# Patient Record
Sex: Male | Born: 1965 | Race: White | Hispanic: No | Marital: Married
Health system: Southern US, Community
[De-identification: ages and names within clinical notes are randomized; demographics above are authoritative.]

---

## 2019-08-18 ENCOUNTER — Emergency Department (HOSPITAL_COMMUNITY): Payer: BC Managed Care – PPO

## 2019-08-18 ENCOUNTER — Emergency Department (HOSPITAL_COMMUNITY)
Admission: EM | Admit: 2019-08-18 | Discharge: 2019-08-18 | Discharge status: Against Medical Advice | Payer: BC Managed Care – PPO | Attending: Emergency Medicine | Admitting: Emergency Medicine

## 2019-08-18 ENCOUNTER — Other Ambulatory Visit: Payer: Self-pay

## 2019-08-18 DIAGNOSIS — L03032 Cellulitis of left toe: Secondary | ICD-10-CM | POA: Insufficient documentation

## 2019-08-18 DIAGNOSIS — L02612 Cutaneous abscess of left foot: Secondary | ICD-10-CM | POA: Diagnosis not present

## 2019-08-18 DIAGNOSIS — M79675 Pain in left toe(s): Secondary | ICD-10-CM | POA: Diagnosis present

## 2019-08-18 LAB — CBC WITH DIFFERENTIAL/PLATELET
Abs Immature Granulocytes: 0.03 10*3/uL (ref 0.00–0.07)
Basophils Absolute: 0.1 10*3/uL (ref 0.0–0.1)
Basophils Relative: 1 %
Eosinophils Absolute: 0.1 10*3/uL (ref 0.0–0.5)
Eosinophils Relative: 1 %
HCT: 43.9 % (ref 39.0–52.0)
Hemoglobin: 15.5 g/dL (ref 13.0–17.0)
Immature Granulocytes: 0 %
Lymphocytes Relative: 13 %
Lymphs Abs: 1.2 10*3/uL (ref 0.7–4.0)
MCH: 33 pg (ref 26.0–34.0)
MCHC: 35.3 g/dL (ref 30.0–36.0)
MCV: 93.4 fL (ref 80.0–100.0)
Monocytes Absolute: 0.5 10*3/uL (ref 0.1–1.0)
Monocytes Relative: 5 %
Neutro Abs: 7.2 10*3/uL (ref 1.7–7.7)
Neutrophils Relative %: 80 %
Platelets: 166 10*3/uL (ref 150–400)
RBC: 4.7 MIL/uL (ref 4.22–5.81)
RDW: 11.8 % (ref 11.5–15.5)
WBC: 8.9 10*3/uL (ref 4.0–10.5)
nRBC: 0 % (ref 0.0–0.2)

## 2019-08-18 LAB — BASIC METABOLIC PANEL
Anion gap: 13 (ref 5–15)
BUN: 9 mg/dL (ref 6–20)
CO2: 24 mmol/L (ref 22–32)
Calcium: 9.8 mg/dL (ref 8.9–10.3)
Chloride: 102 mmol/L (ref 98–111)
Creatinine, Ser: 0.78 mg/dL (ref 0.61–1.24)
GFR calc Af Amer: >60 mL/min (ref >60)
GFR calc non Af Amer: >60 mL/min (ref >60)
Glucose, Bld: 145 mg/dL — ABNORMAL HIGH (ref 70–99)
Potassium: 4.7 mmol/L (ref 3.5–5.1)
Sodium: 139 mmol/L (ref 135–145)

## 2019-08-18 LAB — URINALYSIS, ROUTINE W REFLEX MICROSCOPIC
Bilirubin Urine: NEGATIVE
Glucose, UA: NEGATIVE mg/dL
Hgb urine dipstick: NEGATIVE
Ketones, ur: 5 mg/dL — AB
Leukocytes,Ua: NEGATIVE
Nitrite: NEGATIVE
Protein, ur: NEGATIVE mg/dL
Specific Gravity, Urine: 1.012 (ref 1.005–1.030)
pH: 5 (ref 5.0–8.0)

## 2019-08-18 LAB — PROTIME-INR
INR: 1 (ref 0.8–1.2)
Prothrombin Time: 12.7 seconds (ref 11.4–15.2)

## 2019-08-18 LAB — LACTIC ACID, PLASMA: Lactic Acid, Venous: 2.1 mmol/L (ref 0.5–1.9)

## 2019-08-18 MED ORDER — LIDOCAINE HCL (PF) 1 % IJ SOLN
5.0000 mL | Freq: Once | INTRAMUSCULAR | Status: AC
  Administered 2019-08-18: 5 mL
  Filled 2019-08-18: qty 30

## 2019-08-18 MED ORDER — VANCOMYCIN HCL IN DEXTROSE 1-5 GM/200ML-% IV SOLN
1000.0000 mg | Freq: Once | INTRAVENOUS | Status: AC
  Administered 2019-08-18: 1000 mg via INTRAVENOUS
  Filled 2019-08-18: qty 200

## 2019-08-18 MED ORDER — DOXYCYCLINE HYCLATE 100 MG PO CAPS: 100.0000 mg | ORAL_CAPSULE | Freq: Two times a day (BID) | ORAL | 0 refills | Status: AC

## 2019-08-18 MED ORDER — SODIUM CHLORIDE 0.9 % IV BOLUS
1000.0000 mL | Freq: Once | INTRAVENOUS | Status: AC
  Administered 2019-08-18: 1000 mL via INTRAVENOUS

## 2019-08-18 MED ORDER — SODIUM CHLORIDE 0.9 % IV BOLUS: 1000.0000 mL | Freq: Once | INTRAVENOUS | Status: DC

## 2019-08-18 NOTE — ED Notes (Signed)
Discharge paperwork and prescription given to pt.  Pt with no questions or concerns at this time, ambulatory at discharge, NAD.

## 2019-08-18 NOTE — ED Notes (Signed)
Blood cultures x2 collected prior to antibiotic administration.  

## 2019-08-18 NOTE — ED Triage Notes (Signed)
Pt reports noticing red spot on middle of left middle toe last weekend.  Pt reports since that time red spot worsened, got larger, now scabbed over.  Entire toe is red, spreading to ventral portion of left foot.  Pt saw MD who prescribed (Friday 3/12)  antibiotics, pt reports taking as prescribed.

## 2019-08-18 NOTE — ED Provider Notes (Signed)
[facility] DEPT Provider Note   CSN: [id number] Arrival date & time: 08/18/19  0844    History Toe Pain  Jerry Francis is a 54 y.o. male with no significant past medical history who presents for evaluation of toe pain. Pain began 1 week ago. Started on ABX Bactrim- 3 days ago. Compliant with meds. Unsure if dc from toe. No prior hx of IVD, diabetes, osteomyelitis. Denies fever, chills, N/V, CP, SOB, abd pain, paresthesias. Has noted redness now progressing up from toe into foot with increased redness, warmth. Denies additional aggravating or alleviating factors. Rates pain a 3/10.   No recent injury or trauma.  UP to date on Tetanus- 1 year ago  History obtained from patient and past medical records. No interpretor was used.  HPI     No past medical history on file.  There are no problems to display for this patient.   History reviewed.     No family history on file.  Social History   Tobacco Use  . Smoking status: Not on file  Substance Use Topics  . Alcohol use: Not on file  . Drug use: Not on file    Home Medications Prior to Admission medications   Medication Sig Start Date End Date Taking? Authorizing Provider  doxycycline (VIBRAMYCIN) 100 MG capsule Take 1 capsule (100 mg total) by mouth 2 (two) times daily. 08/18/19   Dula Havlik A, PA-C    Allergies    Patient has no known allergies.  Review of Systems   Review of Systems  Constitutional: Negative.   HENT: Negative.   Respiratory: Negative.   Cardiovascular: Negative.   Gastrointestinal: Negative.   Genitourinary: Negative.   Skin: Positive for wound.  Neurological: Negative.   All other systems reviewed and are negative.   Physical Exam Updated Vital Signs BP (!) 155/98 (BP Location: Right Arm)   Pulse (!) 113   Temp (S) 99.3 F (37.4 C) (Rectal)   Resp 20   Ht 5\' 9"  (1.753 m)   Wt 88.5 kg   SpO2 98%   BMI 28.80 kg/m   Physical Exam Vitals and  nursing note reviewed.  Constitutional:      General: He is not in acute distress.    Appearance: He is well-developed. He is not ill-appearing, toxic-appearing or diaphoretic.  HENT:     Head: Normocephalic and atraumatic.     Nose: Nose normal.     Mouth/Throat:     Mouth: Mucous membranes are moist.     Pharynx: Oropharynx is clear.  Eyes:     Pupils: Pupils are equal, round, and reactive to light.  Cardiovascular:     Rate and Rhythm: Regular rhythm. Tachycardia present.     Pulses: Normal pulses.     Heart sounds: Normal heart sounds.     Comments: HR to 130 in room. Pulmonary:     Effort: Pulmonary effort is normal. No respiratory distress.     Breath sounds: Normal breath sounds.     Comments: Speaks in full sentences without difficulty. Abdominal:     General: Bowel sounds are normal. There is no distension.     Palpations: Abdomen is soft.  Musculoskeletal:        General: Normal range of motion.     Cervical back: Normal range of motion and neck supple.     Comments: Compartments sfot. Tenderness diffusely to 2nd digit on LLE. Wiggles toes without difficulty.  Skin:    General: Skin is  warm and dry.     Capillary Refill: Capillary refill takes less than 2 seconds.     Findings: Lesion present.     Comments: Cap refill intact. Erythema, warmth to LLE 2nd digit just extending into the dorsal aspect of foot. Yellow crusted drainage surrounding nail bed. Crusted ulceration over PIP to 2nd digit.  Neurological:     Mental Status: He is alert.     Sensory: Sensation is intact.     Motor: Motor function is intact.     Coordination: Coordination is intact.     Gait: Gait is intact.     Comments: Intact sensation. Ambulatory without difficulty.        ED Results / Procedures / Treatments   Labs (all labs ordered are listed, but only abnormal results are displayed) Labs Reviewed  BASIC METABOLIC PANEL - Abnormal; Notable for the following components:      Result  Value   Glucose, Bld 145 (*)    All other components within normal limits  LACTIC ACID, PLASMA - Abnormal; Notable for the following components:   Lactic Acid, Venous 2.1 (*)    All other components within normal limits  URINALYSIS, ROUTINE W REFLEX MICROSCOPIC - Abnormal; Notable for the following components:   Ketones, ur 5 (*)    All other components within normal limits  CULTURE, BLOOD (ROUTINE X 2)  CULTURE, BLOOD (ROUTINE X 2)  CBC WITH DIFFERENTIAL/PLATELET  LACTIC ACID, PLASMA  PROTIME-INR    EKG None  Radiology DG Foot Complete Left  Result Date: 08/18/2019 CLINICAL DATA:  Ulcer with cellulitis to second digit EXAM: LEFT FOOT - COMPLETE 3+ VIEW COMPARISON:  None. FINDINGS: Alignment is anatomic. No acute fracture. There are no erosive changes. No periosteal reaction identified. No soft tissue air. Degenerative changes at the first metatarsophalangeal joint. IMPRESSION: No evidence of acute osteomyelitis. Electronically Signed   By: Guadlupe Spanish M.D.   On: 08/18/2019 10:09    Procedures .Marland KitchenIncision and Drainage  Date/Time: 08/18/2019 10:52 AM Performed by: Linwood Dibbles, PA-C Authorized by: Linwood Dibbles, PA-C   Consent:    Consent obtained:  Verbal   Consent given by:  Patient   Risks discussed:  Bleeding, incomplete drainage, pain and damage to other organs   Alternatives discussed:  No treatment Universal protocol:    Procedure explained and questions answered to patient or proxy's satisfaction: yes     Relevant documents present and verified: yes     Test results available and properly labeled: yes     Imaging studies available: yes     Required blood products, implants, devices, and special equipment available: yes     Site/side marked: yes     Immediately prior to procedure a time out was called: yes     Patient identity confirmed:  Verbally with patient Location:    Type:  Abscess   Location:  Lower extremity   Lower extremity location:  Toe    Toe location:  L second toe Pre-procedure details:    Skin preparation:  Betadine Anesthesia (see MAR for exact dosages):    Anesthesia method:  Local infiltration   Local anesthetic:  Lidocaine 1% w/o epi Procedure type:    Complexity:  Complex Procedure details:    Incision types:  Single straight   Incision depth:  Subcutaneous   Scalpel blade:  11   Wound management:  Probed and deloculated, irrigated with saline and extensive cleaning   Drainage:  Purulent   Drainage amount:  Moderate   Wound treatment:  Wound left open Post-procedure details:    Patient tolerance of procedure:  Tolerated well, no immediate complications   (including critical care time)  Medications Ordered in ED Medications  sodium chloride 0.9 % bolus 1,000 mL (has no administration in time range)  sodium chloride 0.9 % bolus 1,000 mL (0 mLs Intravenous Stopped 08/18/19 1051)  vancomycin (VANCOCIN) IVPB 1000 mg/200 mL premix (0 mg Intravenous Stopped 08/18/19 1051)  lidocaine (PF) (XYLOCAINE) 1 % injection 5 mL (5 mLs Infiltration Given by Other 08/18/19 1038)    ED Course  I have reviewed the triage vital signs and the nursing notes.  Pertinent labs & imaging results that were available during my care of the patient were reviewed by me and considered in my medical decision making (see chart for details).  54 year old presents for evaluation of toe pain.  Afebrile, nonseptic appearing.  Patient currently on Bactrim x3 days with worsening erythema.  He has some crusted drainage to second digit on left lower extremity.  Compartments are soft. Brisk capillary refill.  Patient is tachycardic to 130 in the room.  No history of IV drug use, osteomyelitis or diabetes.  Patient does not member any injury or breaks in skin.  Worsening erythema since starting Bactrim.  Will get labs, IV fluids, imaging reevaluate. Up to date on tetanus.  Labs and imaging personally reviewed and interpreted: CBC without leukocytosis  BMP mildly elevated glucose however no additional electrolyte, renal or liver abnormality. DG foot no osteomyelitis, fracture, dislocation. Lactic acid 2.1 BC pending EKG tachycardic. Discussed with attending. Low suspicion for SVT. Likely sinus tach.  Patient reassessed. Tachycardia improving. Fluctuance at proximal nail bed. Will trial I&D at bedside.  I & D performed with moderate purulent drainage at proximal nailbed. Discussed inpatient admission for IV ABx and possible MR to r/o osteomyelitis given worsening cellulitis despite ABX at home. Patient adamant about leaving.  Patient's tachycardia has improved however he still does have some tachycardia.  He is refusing second bag of IV fluids. He was given Vanc here in the ED. Patient does meet SIRS criteria with tachycardia, elevated lactic acid and source of infection. Discussed patient would be leaving AGAINST MEDICAL ADVICE. Discussed risk vs benefit. Continues to decline admission. Will dc home with change in ABX and discussed close follow up with Podiatry. Cellulitis marked on foot.   We discussed the nature and purpose, risks and benefits, as well as, the alternatives of treatment. Time was given to allow the opportunity to ask questions and consider their options, and after the discussion, the patient decided to refuse the offerred treatment. The patient was informed that refusal could lead to, but was not limited to, death, permanent disability, or severe pain. If present, I asked the relatives or significant others to dissuade them without success. Prior to refusing, I determined that the patient had the capacity to make their decision and understood the consequences of that decision. After refusal, I made every reasonable opportunity to treat them to the best of my ability.  The patient was notified that they may return to the emergency department at any time for further treatment.      MDM Rules/Calculators/A&P                        Final Clinical Impression(s) / ED Diagnoses Final diagnoses:  Cellulitis of toe of left foot  Abscess, toe, left    Rx / DC Orders  ED Discharge Orders         Ordered    doxycycline (VIBRAMYCIN) 100 MG capsule  2 times daily     08/18/19 1053           Doniel Maiello A, PA-C 08/18/19 1056    Margarita Grizzle, MD 08/19/19 1135

## 2019-08-18 NOTE — ED Notes (Signed)
Urinal at bedside. Pt has been made aware that MD needs UA sample. 

## 2019-08-18 NOTE — Discharge Instructions (Addendum)
You are leaving AGAINST MEDICAL ADVICE.  I have changed her antibiotics. Take the antibiotics as prescribed.  Follow-up with podiatry in 2 days.  Return to emergency department for any new worsening symptoms.

## 2019-08-21 ENCOUNTER — Ambulatory Visit: Payer: BC Managed Care – PPO | Admitting: Podiatry

## 2019-08-21 ENCOUNTER — Other Ambulatory Visit: Payer: Self-pay

## 2019-08-21 ENCOUNTER — Encounter: Payer: Self-pay | Admitting: Podiatry

## 2019-08-21 DIAGNOSIS — L03031 Cellulitis of right toe: Secondary | ICD-10-CM

## 2019-08-21 DIAGNOSIS — L02611 Cutaneous abscess of right foot: Secondary | ICD-10-CM

## 2019-08-21 MED ORDER — MUPIROCIN 2 % EX OINT: TOPICAL_OINTMENT | CUTANEOUS | 2 refills | Status: AC

## 2019-08-21 MED ORDER — DOXYCYCLINE HYCLATE 100 MG PO TABS: 100.0000 mg | ORAL_TABLET | Freq: Two times a day (BID) | ORAL | 1 refills | Status: AC

## 2019-08-21 NOTE — Progress Notes (Signed)
Subjective:  Patient ID: Jerry Francis, male    DOB: 09-08-1965,  MRN: [id number] HPI Chief Complaint  Patient presents with  . New Patient (Initial Visit)  . Hospital Follow up    cellulitis , right 2nd digit. red, swelling with drainage , was prescribed antibiotics but didn't help any.     54 y.o. male presents with the above complaint.   ROS: He denies fever chills nausea vomiting muscle aches pains calf pain back pain chest pain shortness of breath.  No past medical history on file.   Current Outpatient Medications:  .  doxycycline (VIBRA-TABS) 100 MG tablet, Take 1 tablet (100 mg total) by mouth 2 (two) times daily., Disp: 20 tablet, Rfl: 1 .  doxycycline (VIBRAMYCIN) 100 MG capsule, Take 1 capsule (100 mg total) by mouth 2 (two) times daily., Disp: 20 capsule, Rfl: 0 .  mupirocin ointment (BACTROBAN) 2 %, Apply to wound twice a day., Disp: 30 g, Rfl: 2  No Known Allergies Review of Systems Objective:  There were no vitals filed for this visit.  General: Well developed, nourished, in no acute distress, alert and oriented x3   Dermatological: Skin is warm, dry and supple bilateral. Nails x 10 are well maintained; remaining integument appears unremarkable at this time. There are no open sores, no preulcerative lesions, no rash or signs of infection present.  Cellulitis of the second digit of the left foot extending to the level of the metatarsophalangeal joint.  It appears to be receding from the line that was drawn by the emergency department.  He states that the toe looks better and it feels better.  Currently the toe does demonstrate cellulitis and skin breakdown overlying the PIPJ.  This area was debrided today not demonstrate any type of necrosis or deep tissue problems.  Obvious skin is infection is present there is some dried gold crust around it most likely indicative of strep.  There was some purulence from beneath the medial margin which I was able to sample for culture  and sensitivity.  Vascular: Dorsalis Pedis artery and Posterior Tibial artery pedal pulses are 2/4 bilateral with immedate capillary fill time. Pedal hair growth present. No varicosities and no lower extremity edema present bilateral.   Neruologic: Grossly intact via light touch bilateral. Vibratory intact via tuning fork bilateral. Protective threshold with Semmes Wienstein monofilament intact to all pedal sites bilateral. Patellar and Achilles deep tendon reflexes 2+ bilateral. No Babinski or clonus noted bilateral.   Musculoskeletal: No gross boney pedal deformities bilateral. No pain, crepitus, or limitation noted with foot and ankle range of motion bilateral. Muscular strength 5/5 in all groups tested bilateral.  Gait: Unassisted, Nonantalgic.    Radiographs:  Radiographs were reviewed that were taken in the emergency department not demonstrating any type of bone trauma or any osteomyelitis.  Assessment & Plan:   Assessment: Cellulitis with what appears to be a infection from a bug bite such as a spider.  Plan: At this point I demonstrated to him how to wrap the toe on a daily basis recommended he continue out of work however he states that he is got his start back to work.  I refilled his doxycycline and placed him on Bactroban ointment to be applied twice daily after soaking Epson salts and warm water and then a dressing.  A culture and sensitivity was performed today we will be awaiting the conclusion of that.  We will probably then change his antibiotic if need be.  Otherwise we will follow-up  with him in 1 week I was able to lightly debride the area today not demonstrating any deep tissues.     Jaxsin Bottomley T. [place], [place]

## 2019-08-23 LAB — CULTURE, BLOOD (ROUTINE X 2)
Culture: NO GROWTH
Culture: NO GROWTH
Special Requests: ADEQUATE

## 2019-08-24 LAB — WOUND CULTURE
MICRO NUMBER:: 10266375
SPECIMEN QUALITY:: ADEQUATE

## 2019-08-28 ENCOUNTER — Other Ambulatory Visit: Payer: Self-pay

## 2019-08-28 ENCOUNTER — Encounter: Payer: Self-pay | Admitting: Podiatry

## 2019-08-28 ENCOUNTER — Ambulatory Visit: Payer: BC Managed Care – PPO | Admitting: Podiatry

## 2019-08-28 DIAGNOSIS — L02612 Cutaneous abscess of left foot: Secondary | ICD-10-CM

## 2019-08-28 DIAGNOSIS — L03032 Cellulitis of left toe: Secondary | ICD-10-CM

## 2019-08-28 MED ORDER — CLINDAMYCIN HCL 150 MG PO CAPS: 150.0000 mg | ORAL_CAPSULE | Freq: Three times a day (TID) | ORAL | 1 refills | Status: AC

## 2019-08-28 NOTE — Progress Notes (Signed)
He presents today for follow-up of his second toe left foot.  He states that it seems to be doing a little bit better.  Objective: Vital signs are stable he is alert and oriented x3.  Pulses are palpable.  Pathology result today does demonstrate MRSA positive organism sensitive to clindamycin with low chance for conversion.  The wound appears to be healing very nicely cellulitis is diminishing considerably.  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  Plan: Debrided the area today placed him on clindamycin 153 times a day follow-up with him in 2 weeks watch for signs symptoms of worsening infection notify us if there are any or go to the emergency department.  I will follow-up with him with any questions or concerns

## 2019-08-29 ENCOUNTER — Telehealth: Payer: Self-pay | Admitting: *Deleted

## 2019-08-29 NOTE — Telephone Encounter (Signed)
Dr. Al Corpus informed pt of his change of orders on 08/28/2019.

## 2019-08-29 NOTE — Telephone Encounter (Signed)
-----   Message from Elinor Parkinson, [place] sent at 08/26/2019  7:57 AM EDT ----- Change his abx to clindamycin please.

## 2019-09-16 ENCOUNTER — Other Ambulatory Visit: Payer: Self-pay

## 2019-09-16 ENCOUNTER — Ambulatory Visit: Payer: BC Managed Care – PPO | Admitting: Podiatry

## 2019-09-16 DIAGNOSIS — L02612 Cutaneous abscess of left foot: Secondary | ICD-10-CM | POA: Diagnosis not present

## 2019-09-16 DIAGNOSIS — L03032 Cellulitis of left toe: Secondary | ICD-10-CM | POA: Diagnosis not present

## 2019-09-16 NOTE — Progress Notes (Signed)
Presents today for follow-up of MRSA infection to his second digit on his left foot.  States that is healing very nicely he is very happy with the outcome had no problems taking the medication.  Objective: Vital signs are stable he is alert and oriented x3.  Pulses are palpable.  Neurologic sensorium is intact deep tendon reflexes are intact toes going on to heal up uneventfully still has some postinflammatory hyperpigmentation associated with it.  Assessment: Well-healing MRSA infection.  Plan: At this point I instructed him to let anyone who is going to do surgery on him know that he has had a MRSA infection in the past so that he could be swabbed as a carrier.  I will follow-up with him on an as-needed basis.

## 2021-11-14 IMAGING — DX DG FOOT COMPLETE 3+V*L*
3 series · 3 of 3 positions shown · non-contrast
Comparison: None.

CLINICAL DATA: Ulcer with cellulitis to second digit

EXAM:
LEFT FOOT - COMPLETE 3+ VIEW

[foot ap]
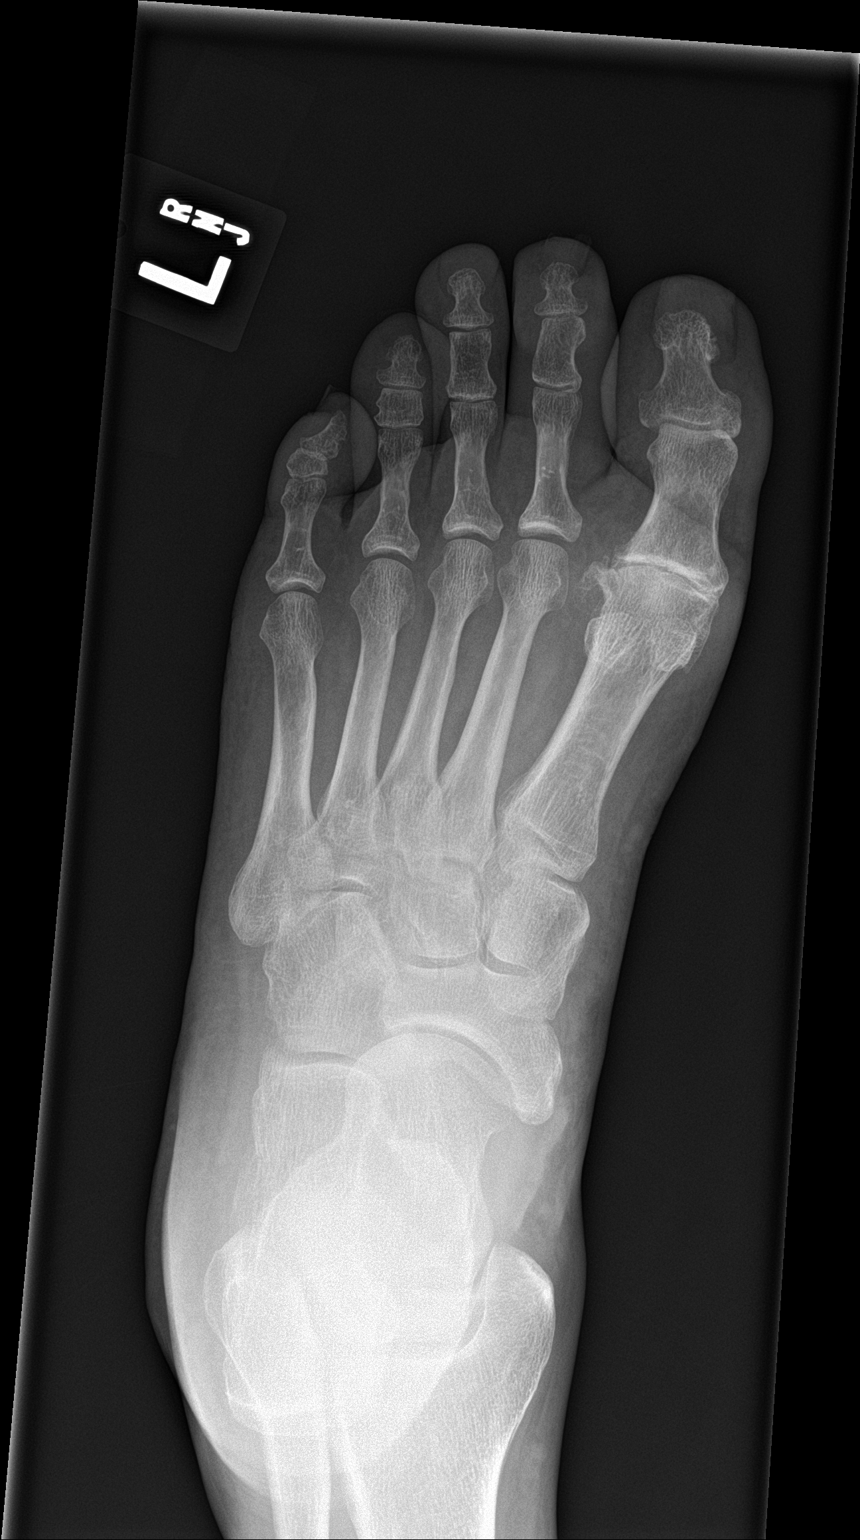

[foot obl]
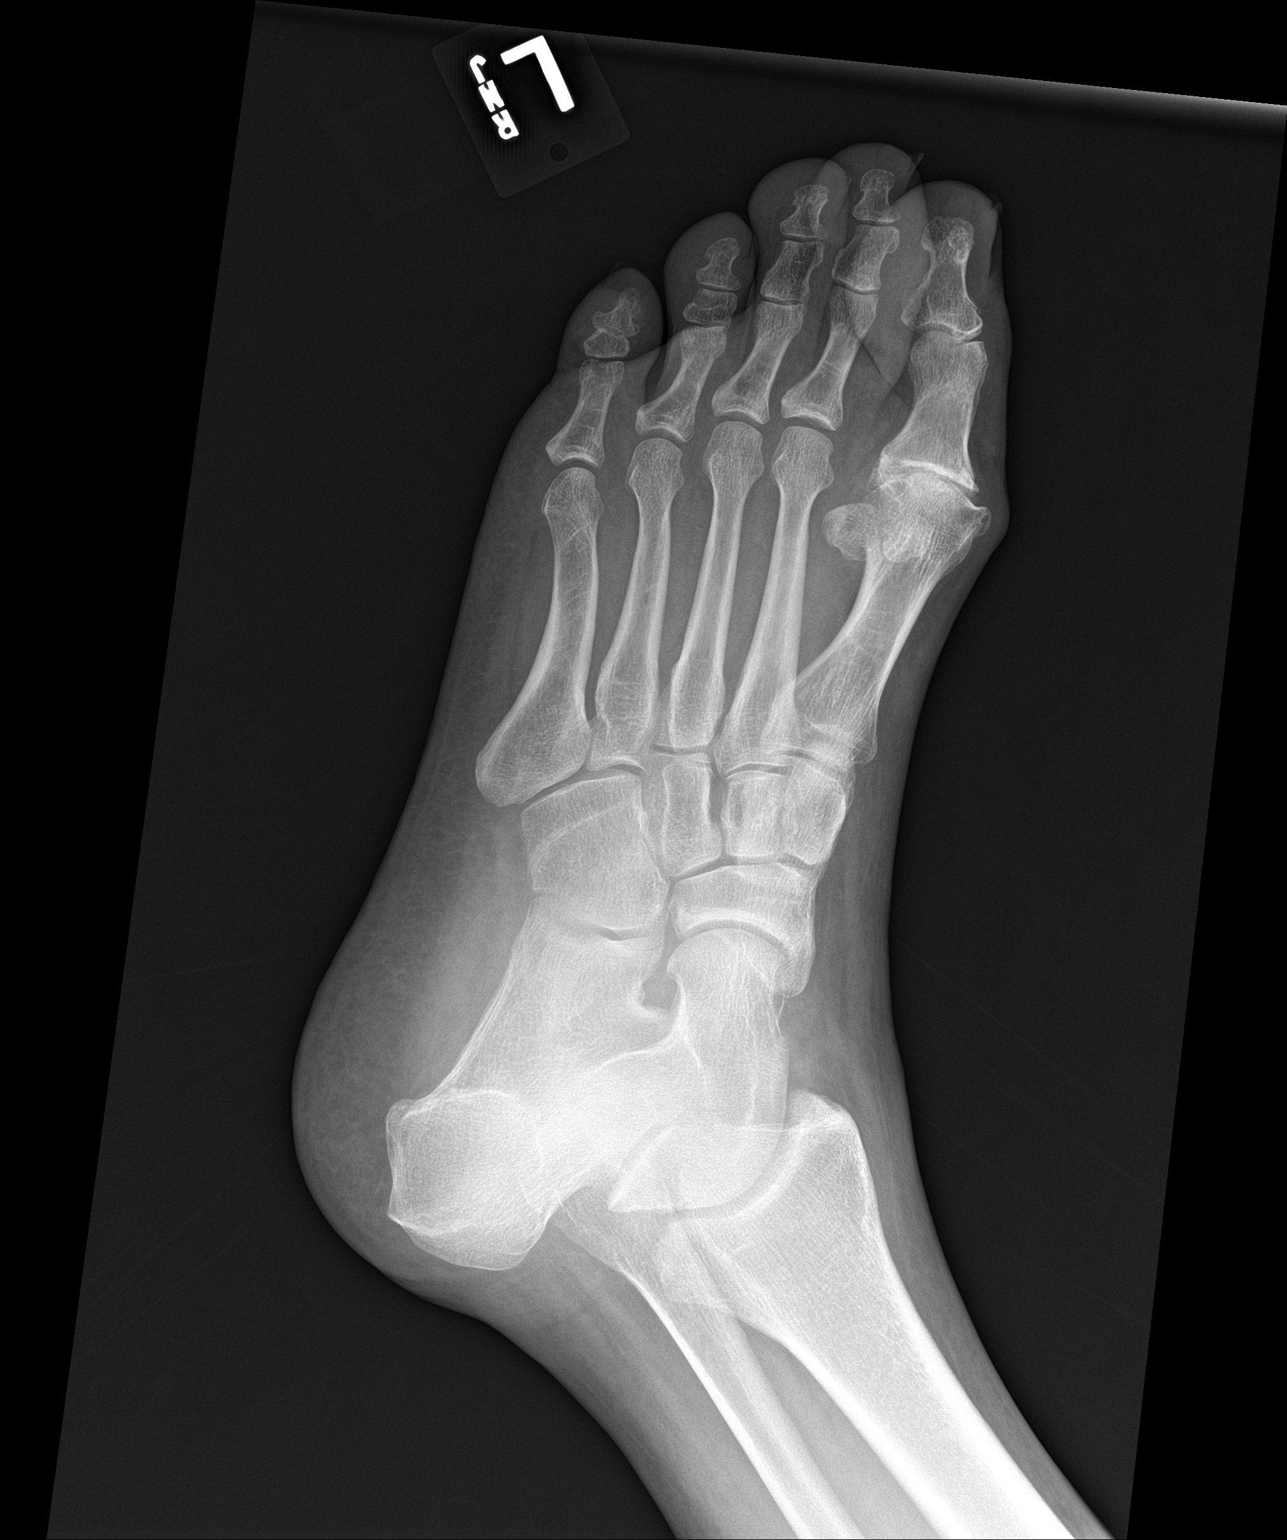

[foot lat]
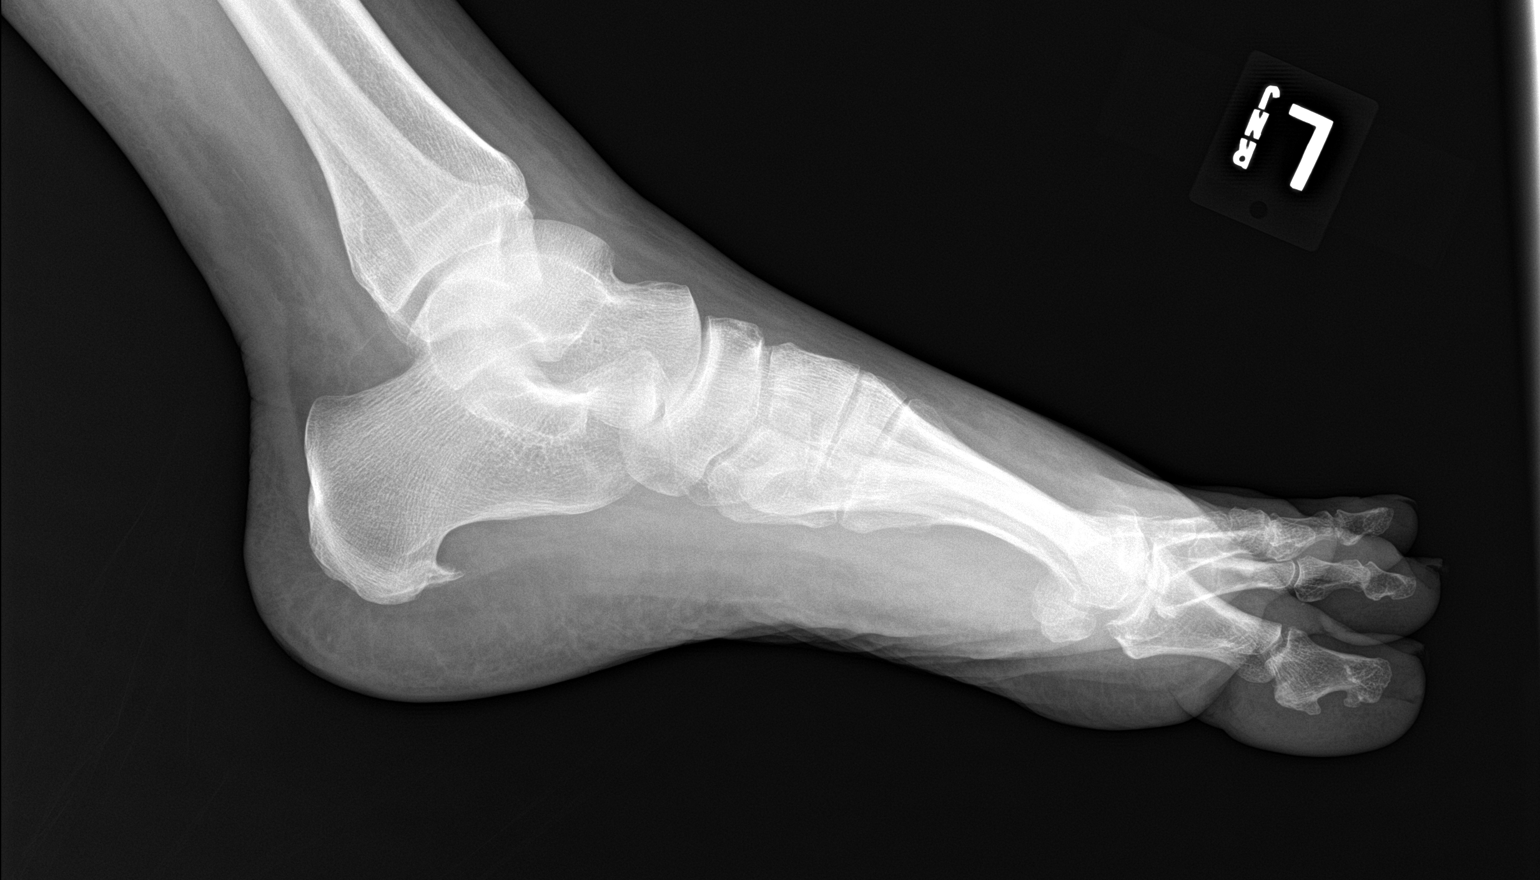

[3 of 3 positions shown; findings below may reference images not displayed]

FINDINGS: Alignment is anatomic. No acute fracture. There are no erosive
changes. No periosteal reaction identified. No soft tissue air.
Degenerative changes at the first metatarsophalangeal joint.
IMPRESSION: No evidence of acute osteomyelitis.
